# Patient Record
Sex: Female | Born: 1985 | Race: White | Hispanic: No | Marital: Single | Smoking: Current every day smoker
Health system: Southern US, Community
[De-identification: ages and names within clinical notes are randomized; demographics above are authoritative.]

---

## 2019-01-09 ENCOUNTER — Encounter (HOSPITAL_COMMUNITY): Payer: Self-pay

## 2019-01-09 ENCOUNTER — Emergency Department (HOSPITAL_COMMUNITY)
Admission: EM | Admit: 2019-01-09 | Discharge: 2019-01-10 | Payer: Self-pay | Attending: Emergency Medicine | Admitting: Emergency Medicine

## 2019-01-09 ENCOUNTER — Emergency Department (HOSPITAL_COMMUNITY): Payer: Self-pay

## 2019-01-09 ENCOUNTER — Other Ambulatory Visit: Payer: Self-pay

## 2019-01-09 ENCOUNTER — Ambulatory Visit (HOSPITAL_COMMUNITY)
Admission: EM | Admit: 2019-01-09 | Discharge: 2019-01-09 | Disposition: A | Discharge status: Home / Self Care | Payer: No Typology Code available for payment source | Attending: Emergency Medicine | Admitting: Emergency Medicine

## 2019-01-09 DIAGNOSIS — Y9389 Activity, other specified: Secondary | ICD-10-CM | POA: Insufficient documentation

## 2019-01-09 DIAGNOSIS — Y998 Other external cause status: Secondary | ICD-10-CM | POA: Insufficient documentation

## 2019-01-09 DIAGNOSIS — S0083XA Contusion of other part of head, initial encounter: Secondary | ICD-10-CM | POA: Insufficient documentation

## 2019-01-09 DIAGNOSIS — S0990XA Unspecified injury of head, initial encounter: Secondary | ICD-10-CM | POA: Insufficient documentation

## 2019-01-09 DIAGNOSIS — M546 Pain in thoracic spine: Secondary | ICD-10-CM | POA: Insufficient documentation

## 2019-01-09 DIAGNOSIS — R0781 Pleurodynia: Secondary | ICD-10-CM | POA: Insufficient documentation

## 2019-01-09 DIAGNOSIS — S0993XA Unspecified injury of face, initial encounter: Secondary | ICD-10-CM

## 2019-01-09 DIAGNOSIS — Z0489 Encounter for examination and observation for other specified reasons: Secondary | ICD-10-CM | POA: Insufficient documentation

## 2019-01-09 DIAGNOSIS — Y929 Unspecified place or not applicable: Secondary | ICD-10-CM | POA: Insufficient documentation

## 2019-01-09 DIAGNOSIS — Z0441 Encounter for examination and observation following alleged adult rape: Secondary | ICD-10-CM | POA: Insufficient documentation

## 2019-01-09 DIAGNOSIS — M545 Low back pain: Secondary | ICD-10-CM | POA: Insufficient documentation

## 2019-01-09 DIAGNOSIS — T7621XA Adult sexual abuse, suspected, initial encounter: Secondary | ICD-10-CM | POA: Insufficient documentation

## 2019-01-09 DIAGNOSIS — F17200 Nicotine dependence, unspecified, uncomplicated: Secondary | ICD-10-CM | POA: Insufficient documentation

## 2019-01-09 LAB — URINALYSIS, ROUTINE W REFLEX MICROSCOPIC
Bacteria, UA: NONE SEEN
Bilirubin Urine: NEGATIVE
Glucose, UA: NEGATIVE mg/dL
Hgb urine dipstick: NEGATIVE
Ketones, ur: 5 mg/dL — AB
Nitrite: NEGATIVE
Protein, ur: NEGATIVE mg/dL
Specific Gravity, Urine: 1.025 (ref 1.005–1.030)
pH: 6 (ref 5.0–8.0)

## 2019-01-09 LAB — PREGNANCY, URINE: Preg Test, Ur: NEGATIVE
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
## 2019-01-09 NOTE — ED Notes (Signed)
Patient transported to CT 
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
## 2019-01-09 NOTE — ED Notes (Signed)
Urine specimen and sanicloth/tissue used during restroom visit labeled and bagged, placed at bedside. [employer]
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
## 2019-01-10 MED ORDER — AZITHROMYCIN 250 MG PO TABS
1000.0000 mg | ORAL_TABLET | Freq: Once | ORAL | Status: AC
  Administered 2019-01-10: 1000 mg via ORAL
  Filled 2019-01-10: qty 4

## 2019-01-10 MED ORDER — METRONIDAZOLE 500 MG PO TABS
2000.0000 mg | ORAL_TABLET | Freq: Once | ORAL | Status: AC
  Administered 2019-01-10: 2000 mg via ORAL
  Filled 2019-01-10: qty 4

## 2019-01-10 MED ORDER — CEFTRIAXONE SODIUM 250 MG IJ SOLR
250.0000 mg | Freq: Once | INTRAMUSCULAR | Status: AC
  Administered 2019-01-10: 250 mg via INTRAMUSCULAR
  Filled 2019-01-10: qty 250

## 2019-01-10 MED ORDER — LIDOCAINE HCL (PF) 1 % IJ SOLN
0.9000 mL | Freq: Once | INTRAMUSCULAR | Status: AC
  Administered 2019-01-10: 0.9 mL
  Filled 2019-01-10: qty 30
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
## 2019-01-11 NOTE — SANE Note (Signed)
FNE spoke with patient about STI prophylaxis, including HIV, and pregnancy prevention medications.  Patient informed about possible side effects and potential risk of infection.  Patient verbalized understanding and requested STI prophylaxis excluding HIV prophylaxis.  Patient also declined pregnancy prevention medications.
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
